# Patient Record
Sex: Female | Born: 1985
Health system: Southern US, Community
[De-identification: ages and names within clinical notes are randomized; demographics above are authoritative.]

---
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
## 2007-02-28 ENCOUNTER — Encounter (INDEPENDENT_AMBULATORY_CARE_PROVIDER_SITE_OTHER): Payer: Self-pay | Admitting: Plastic Surgery

## 2007-02-28 ENCOUNTER — Ambulatory Visit (HOSPITAL_BASED_OUTPATIENT_CLINIC_OR_DEPARTMENT_OTHER): Admission: RE | Admit: 2007-02-28 | Discharge: 2007-02-28 | Payer: Self-pay | Admitting: Plastic Surgery

## 2007-11-07 ENCOUNTER — Other Ambulatory Visit: Admission: RE | Admit: 2007-11-07 | Discharge: 2007-11-07 | Payer: Self-pay | Admitting: Family Medicine

## 2008-11-25 ENCOUNTER — Other Ambulatory Visit: Admission: RE | Admit: 2008-11-25 | Discharge: 2008-11-25 | Payer: Self-pay | Admitting: Family Medicine

## 2010-03-03 ENCOUNTER — Other Ambulatory Visit: Admission: RE | Admit: 2010-03-03 | Discharge: 2010-03-03 | Payer: Self-pay | Admitting: Family Medicine

## 2010-12-01 NOTE — Op Note (Signed)
Emily Clayton, Emily Clayton               ACCOUNT NO.:  [id number]   MEDICAL RECORD NO.:  [id number]          PATIENT TYPE:  AMB   LOCATION:  DSC                          FACILITY:  [facility]   PHYSICIAN:  Mary Contogiannis, M.D.DATE OF BIRTH:  Aug 31, 1985   DATE OF PROCEDURE:  02/28/2007  DATE OF DISCHARGE:                               OPERATIVE REPORT   PREOPERATIVE DIAGNOSIS:  Left lateral breast dysplastic nevus with  moderate to marked atypia.   POSTOPERATIVE DIAGNOSIS:  Left lateral breast dysplastic nevus with  moderate to marked atypia.   PROCEDURE:  1. Wide local excision of 2.1 cm dysplastic nevus with marked atypia,      left lateral breast.  2. Complex closure of 5 cm left breast incision.   ATTENDING SURGEON:  Brantley Persons, M.D.   ANESTHESIA:  1% lidocaine with epinephrine.   COMPLICATIONS:  None.   INDICATIONS FOR PROCEDURE:  The patient is a 25 year old Caucasian  female who has been referred by Dr. Campbell Stall for biopsy proven  dysplastic nevus with marked atypia.  The pathologist feels that there  is high grade atypia and, therefore, the patient needs a significant re-  excision of this dysplastic nevus.  The excision will proceed with 5 mm  circumferential margins.  This is done at the patient's request.   DESCRIPTION OF PROCEDURE:  The patient was brought into the minor room  and placed on the table in a supine position.  The breast was prepped  with Betadine and draped in a sterile fashion.  The area of the healing  biopsy site and any residual dysplastic nevus had been properly marked.  5 mm circumferential margins were marked from the healing biopsy skin  edge.  The incision was infiltrated with 1% lidocaine with epinephrine.  After adequate hemostasis and anesthesia had taken effect, the procedure  was begun.   Using loupe magnification, the margins had been marked properly for the  patient.  The skin lesion was excised full thickness through the skin  into the subcutaneous tissues, marked at the 12 o'clock position with a  silk suture, and passed off the table to undergo permanent pathologic  section evaluation.  The skin edges and soft tissues were then  undermined for better closure of the wound.  This was performed with the  Bovie electrocautery.  Meticulous hemostasis was also obtained with the  Bovie electrocautery.  The total length of the excision of the skin  lesion was 2.1 cm.  A complex closure of the incision then proceeded.  The deeper breast parenchymal tissue and deep dermal layer were  reapproximated using a 3-0 Monocryl suture.  The superficial dermal  layer was then closed with 4-0 Monocryl suture.  The incision that was  then closed in the cuticular layer with 4-0 Monocryl in a running  intracuticular stitch.  Total length of complex closure was 5 cm.  The  skin did appear a little fragile and bruised in some areas, so the  closed incision was dressed with bacitracin ointment, Adaptic and 4x4  gauze with Hypafix tape.  There were no complications.  The  patient  tolerated the procedure well.   The patient and her parents are given proper postoperative wound care  instructions.  The patient was discharged home in her parents care in  stable condition.  Follow up appointment will be tomorrow in the office.           ______________________________  Brantley Persons, M.D.     MC/MEDQ  D:  02/28/2007  T:  03/02/2007  Job:  [id number]

## 2011-06-09 ENCOUNTER — Other Ambulatory Visit: Payer: Self-pay | Admitting: Family Medicine

## 2011-06-09 ENCOUNTER — Other Ambulatory Visit (HOSPITAL_COMMUNITY)
Admission: RE | Admit: 2011-06-09 | Discharge: 2011-06-09 | Disposition: A | Discharge status: Home / Self Care | Payer: BC Managed Care – PPO | Source: Ambulatory Visit | Attending: Family Medicine | Admitting: Family Medicine

## 2011-06-09 DIAGNOSIS — Z1159 Encounter for screening for other viral diseases: Secondary | ICD-10-CM | POA: Insufficient documentation

## 2011-06-09 DIAGNOSIS — Z124 Encounter for screening for malignant neoplasm of cervix: Secondary | ICD-10-CM | POA: Insufficient documentation

## 2011-06-09 DIAGNOSIS — Z113 Encounter for screening for infections with a predominantly sexual mode of transmission: Secondary | ICD-10-CM | POA: Insufficient documentation

## 2011-07-07 ENCOUNTER — Other Ambulatory Visit: Payer: Self-pay | Admitting: Obstetrics and Gynecology

## 2011-11-29 ENCOUNTER — Other Ambulatory Visit: Payer: Self-pay | Admitting: Obstetrics and Gynecology

## 2011-11-29 ENCOUNTER — Other Ambulatory Visit (HOSPITAL_COMMUNITY)
Admission: RE | Admit: 2011-11-29 | Discharge: 2011-11-29 | Disposition: A | Discharge status: Home / Self Care | Payer: BC Managed Care – PPO | Source: Ambulatory Visit | Attending: Obstetrics and Gynecology | Admitting: Obstetrics and Gynecology

## 2011-11-29 DIAGNOSIS — N760 Acute vaginitis: Secondary | ICD-10-CM | POA: Insufficient documentation

## 2011-11-29 DIAGNOSIS — Z01419 Encounter for gynecological examination (general) (routine) without abnormal findings: Secondary | ICD-10-CM | POA: Insufficient documentation

## 2018-05-15 ENCOUNTER — Other Ambulatory Visit: Payer: Self-pay | Admitting: Family Medicine

## 2018-05-15 ENCOUNTER — Other Ambulatory Visit (HOSPITAL_COMMUNITY)
Admission: RE | Admit: 2018-05-15 | Discharge: 2018-05-15 | Disposition: A | Discharge status: Home / Self Care | Payer: BLUE CROSS/BLUE SHIELD | Source: Ambulatory Visit | Attending: Family Medicine | Admitting: Family Medicine

## 2018-05-15 DIAGNOSIS — Z124 Encounter for screening for malignant neoplasm of cervix: Secondary | ICD-10-CM | POA: Insufficient documentation

## 2018-05-17 LAB — CYTOLOGY - PAP
Adequacy: ABSENT
Chlamydia: NEGATIVE
DIAGNOSIS: NEGATIVE
HPV (WINDOPATH): DETECTED — AB
NEISSERIA GONORRHEA: NEGATIVE

## 2019-05-29 ENCOUNTER — Other Ambulatory Visit: Payer: Self-pay | Admitting: Family Medicine

## 2019-05-29 ENCOUNTER — Other Ambulatory Visit (HOSPITAL_COMMUNITY)
Admission: RE | Admit: 2019-05-29 | Discharge: 2019-05-29 | Disposition: A | Discharge status: Home / Self Care | Payer: Managed Care, Other (non HMO) | Source: Ambulatory Visit | Attending: Family Medicine | Admitting: Family Medicine

## 2019-05-29 DIAGNOSIS — Z124 Encounter for screening for malignant neoplasm of cervix: Secondary | ICD-10-CM | POA: Insufficient documentation

## 2019-05-30 LAB — CYTOLOGY - PAP
Adequacy: ABSENT
Comment: NEGATIVE
Diagnosis: NEGATIVE
High risk HPV: NEGATIVE

## 2020-06-25 ENCOUNTER — Other Ambulatory Visit: Payer: Self-pay

## 2020-06-26 ENCOUNTER — Encounter: Payer: Self-pay | Admitting: Podiatry

## 2020-06-26 ENCOUNTER — Other Ambulatory Visit: Payer: Self-pay

## 2020-06-26 ENCOUNTER — Ambulatory Visit (INDEPENDENT_AMBULATORY_CARE_PROVIDER_SITE_OTHER): Payer: Managed Care, Other (non HMO)

## 2020-06-26 ENCOUNTER — Ambulatory Visit (INDEPENDENT_AMBULATORY_CARE_PROVIDER_SITE_OTHER): Payer: Managed Care, Other (non HMO) | Admitting: Podiatry

## 2020-06-26 DIAGNOSIS — M779 Enthesopathy, unspecified: Secondary | ICD-10-CM

## 2020-06-26 DIAGNOSIS — B070 Plantar wart: Secondary | ICD-10-CM

## 2020-06-26 NOTE — Patient Instructions (Signed)

## 2020-06-26 NOTE — Progress Notes (Signed)
Subjective:   Patient ID: Emily Clayton, female   DOB: 34 y.o.   MRN: [id number]   HPI Patient states she has had a lesion on her left big toe that is been present for a number of years and recently has gotten worse.  States that it is painful when she tries to wear shoes and she has had it frozen several times with only short-term relief of symptoms.  Patient does not smoke likes to be active   Review of Systems  All other systems reviewed and are negative.       Objective:  Physical Exam Vitals and nursing note reviewed.  Constitutional:      Appearance: She is well-developed and well-nourished.  Cardiovascular:     Pulses: Intact distal pulses.  Pulmonary:     Effort: Pulmonary effort is normal.  Musculoskeletal:        General: Normal range of motion.  Skin:    General: Skin is warm.  Neurological:     Mental Status: She is alert.     Neurovascular status intact muscle strength was found to be adequate range of motion adequate.  Patient is noted to have a lesion 1.2 x 1 cm on the left hallux that upon debridement shows pinpoint bleeding and pain.  Patient has good digit perfusion well oriented x3     Assessment:  Possibility that this is an exostotic lesion versus a soft tissue verruca or other unknown pathological lesion     Plan:  H&P x-ray reviewed condition discussed.  I recommended excision explaining that it could easily regrow and I cannot guarantee what it is and we will send it off for pathology.  Patient wants this procedure and today I infiltrated the left hallux 60 mg Xylocaine Marcaine mixture sterile prep done and using sterile instrumentation I excised the mass I removed it in toto down to the dermal junction and I applied a small amount of phenol to apply sterile dressing.  Patient had it placed in formalin and sent for pathological evaluation will be seen back and I am hopeful that this will come back as verruca plantaris  X-rays indicate there may be a  possible bone spur on the left big toe

## 2020-07-17 ENCOUNTER — Telehealth: Payer: Self-pay | Admitting: Podiatry

## 2020-07-17 NOTE — Telephone Encounter (Signed)
Patient called and wanted to know the results of her pathology report. Please call patient. She is unable to download my chart

## 2020-07-17 NOTE — Telephone Encounter (Signed)
Please let her know it was benign and looked fine

## 2021-07-14 ENCOUNTER — Ambulatory Visit
Admission: RE | Admit: 2021-07-14 | Discharge: 2021-07-14 | Disposition: A | Discharge status: Home / Self Care | Payer: Managed Care, Other (non HMO) | Source: Ambulatory Visit | Attending: Sports Medicine | Admitting: Sports Medicine

## 2021-07-14 ENCOUNTER — Other Ambulatory Visit: Payer: Self-pay | Admitting: Sports Medicine

## 2021-07-14 DIAGNOSIS — M542 Cervicalgia: Secondary | ICD-10-CM

## 2021-07-14 DIAGNOSIS — M25511 Pain in right shoulder: Secondary | ICD-10-CM

## 2022-06-20 IMAGING — CR DG CERVICAL SPINE 2 OR 3 VIEWS
2 series · 2 of 2 positions shown · non-contrast
Comparison: None.

CLINICAL DATA: Neck pain

EXAM:
CERVICAL SPINE - 2-3 VIEW

[w c-spine lat *]
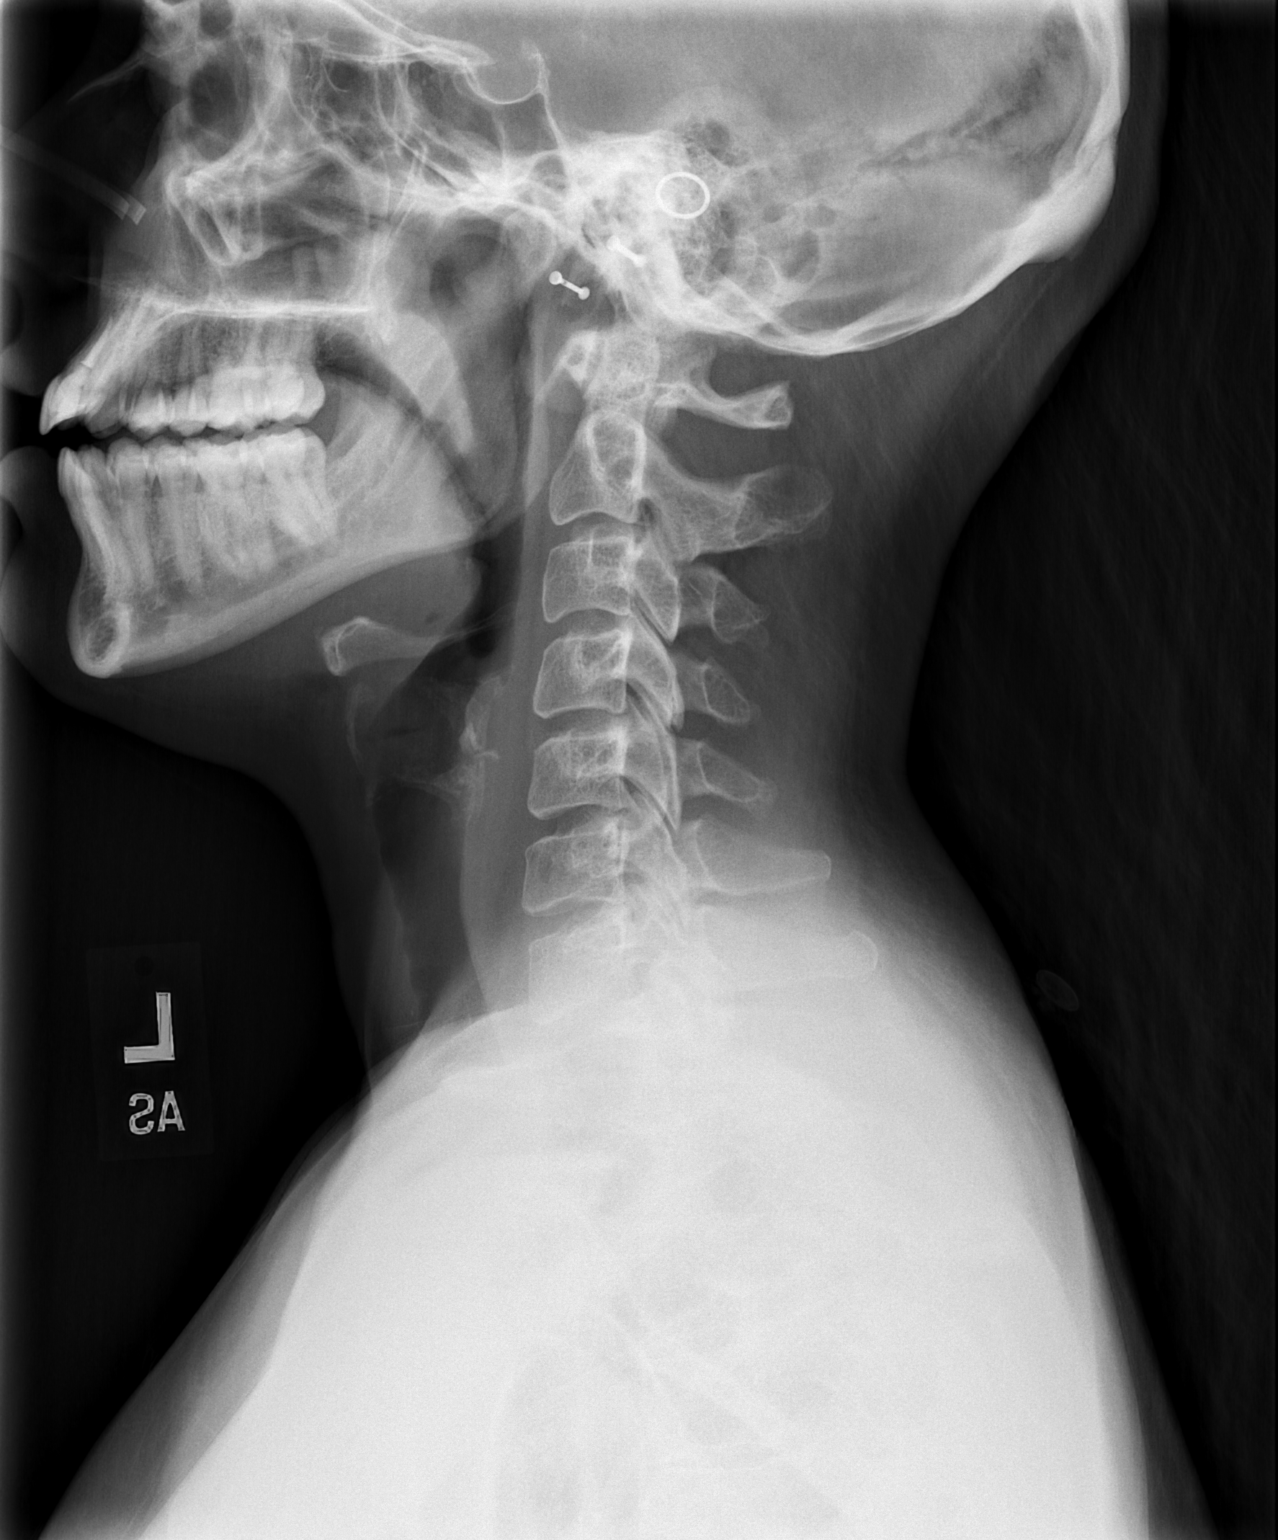

[w c-spine a.p.]
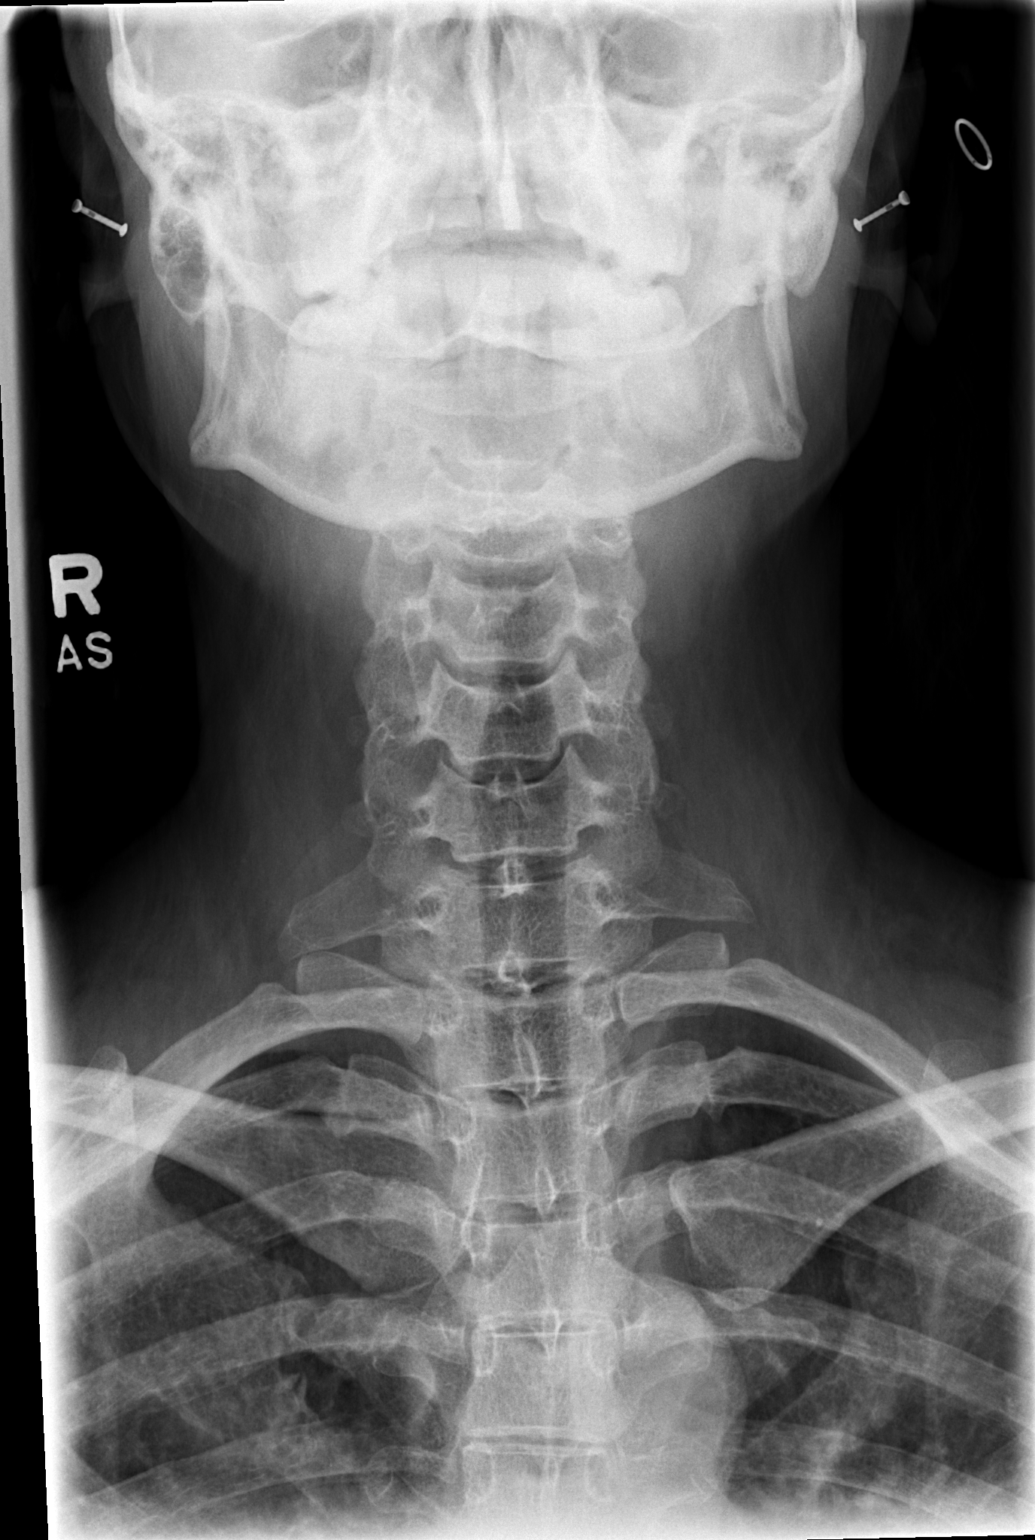

[2 of 2 positions shown; findings below may reference images not displayed]

FINDINGS: Straightening of the cervical spine. Vertebral body heights and disc
spaces appear normal. Normal prevertebral soft tissue thickness
IMPRESSION: Straightening of the cervical spine
